# Patient Record
Sex: Female | Born: 1940 | Race: White | Hispanic: No | Marital: Married | Smoking: Never smoker
Health system: Southern US, Community
[De-identification: ages and names within clinical notes are randomized; demographics above are authoritative.]

## PROBLEM LIST (undated history)

## (undated) DIAGNOSIS — K5190 Ulcerative colitis, unspecified, without complications: Secondary | ICD-10-CM

## (undated) DIAGNOSIS — E785 Hyperlipidemia, unspecified: Secondary | ICD-10-CM

## (undated) DIAGNOSIS — H409 Unspecified glaucoma: Secondary | ICD-10-CM

## (undated) DIAGNOSIS — A159 Respiratory tuberculosis unspecified: Secondary | ICD-10-CM

## (undated) DIAGNOSIS — I10 Essential (primary) hypertension: Secondary | ICD-10-CM

## (undated) HISTORY — DX: Ulcerative colitis, unspecified, without complications: K51.90

## (undated) HISTORY — PX: BREAST BIOPSY: SHX20

## (undated) HISTORY — DX: Hyperlipidemia, unspecified: E78.5

## (undated) HISTORY — PX: BACK SURGERY: SHX140

## (undated) HISTORY — DX: Unspecified glaucoma: H40.9

## (undated) HISTORY — DX: Essential (primary) hypertension: I10

## (undated) HISTORY — DX: Respiratory tuberculosis unspecified: A15.9

---

## 2001-08-05 ENCOUNTER — Encounter: Payer: Self-pay | Admitting: Obstetrics and Gynecology

## 2001-08-05 ENCOUNTER — Encounter: Admission: RE | Admit: 2001-08-05 | Discharge: 2001-08-05 | Payer: Self-pay | Admitting: Obstetrics and Gynecology

## 2001-08-12 ENCOUNTER — Encounter: Admission: RE | Admit: 2001-08-12 | Discharge: 2001-08-12 | Payer: Self-pay | Admitting: Obstetrics and Gynecology

## 2001-08-12 ENCOUNTER — Encounter: Payer: Self-pay | Admitting: Obstetrics and Gynecology

## 2001-11-10 ENCOUNTER — Other Ambulatory Visit: Admission: RE | Admit: 2001-11-10 | Discharge: 2001-11-10 | Payer: Self-pay | Admitting: Obstetrics and Gynecology

## 2002-06-01 ENCOUNTER — Encounter: Payer: Self-pay | Admitting: Gastroenterology

## 2002-06-01 ENCOUNTER — Encounter: Admission: RE | Admit: 2002-06-01 | Discharge: 2002-06-01 | Payer: Self-pay | Admitting: Gastroenterology

## 2006-06-25 ENCOUNTER — Other Ambulatory Visit: Admission: RE | Admit: 2006-06-25 | Discharge: 2006-06-25 | Payer: Self-pay | Admitting: Internal Medicine

## 2006-07-02 ENCOUNTER — Encounter: Admission: RE | Admit: 2006-07-02 | Discharge: 2006-07-02 | Payer: Self-pay | Admitting: Internal Medicine

## 2008-04-06 ENCOUNTER — Other Ambulatory Visit: Admission: RE | Admit: 2008-04-06 | Discharge: 2008-04-06 | Payer: Self-pay | Admitting: Internal Medicine

## 2008-09-29 ENCOUNTER — Encounter: Admission: RE | Admit: 2008-09-29 | Discharge: 2008-09-29 | Payer: Self-pay | Admitting: Internal Medicine

## 2009-02-10 ENCOUNTER — Encounter: Admission: RE | Admit: 2009-02-10 | Discharge: 2009-02-10 | Payer: Self-pay | Admitting: Internal Medicine

## 2009-09-30 ENCOUNTER — Encounter: Admission: RE | Admit: 2009-09-30 | Discharge: 2009-09-30 | Payer: Self-pay | Admitting: Internal Medicine

## 2009-09-30 ENCOUNTER — Other Ambulatory Visit: Admission: RE | Admit: 2009-09-30 | Discharge: 2009-09-30 | Payer: Self-pay | Admitting: Geriatric Medicine

## 2010-11-09 ENCOUNTER — Encounter
Admission: RE | Admit: 2010-11-09 | Discharge: 2010-11-09 | Payer: Self-pay | Source: Home / Self Care | Attending: Internal Medicine | Admitting: Internal Medicine

## 2010-11-28 ENCOUNTER — Encounter
Admission: RE | Admit: 2010-11-28 | Discharge: 2010-11-28 | Payer: Self-pay | Source: Home / Self Care | Attending: Internal Medicine | Admitting: Internal Medicine

## 2012-03-14 ENCOUNTER — Other Ambulatory Visit: Payer: Self-pay | Admitting: Internal Medicine

## 2012-03-14 DIAGNOSIS — Z1231 Encounter for screening mammogram for malignant neoplasm of breast: Secondary | ICD-10-CM

## 2012-03-18 ENCOUNTER — Ambulatory Visit
Admission: RE | Admit: 2012-03-18 | Discharge: 2012-03-18 | Disposition: A | Discharge status: Home / Self Care | Payer: Medicare Other | Source: Ambulatory Visit | Attending: Internal Medicine | Admitting: Internal Medicine

## 2012-03-18 DIAGNOSIS — Z1231 Encounter for screening mammogram for malignant neoplasm of breast: Secondary | ICD-10-CM

## 2013-05-12 ENCOUNTER — Other Ambulatory Visit: Payer: Self-pay

## 2013-05-12 DIAGNOSIS — Z1231 Encounter for screening mammogram for malignant neoplasm of breast: Secondary | ICD-10-CM

## 2013-06-10 ENCOUNTER — Ambulatory Visit
Admission: RE | Admit: 2013-06-10 | Discharge: 2013-06-10 | Disposition: A | Discharge status: Home / Self Care | Payer: Medicare Other | Source: Ambulatory Visit

## 2013-06-10 DIAGNOSIS — Z1231 Encounter for screening mammogram for malignant neoplasm of breast: Secondary | ICD-10-CM

## 2014-10-28 ENCOUNTER — Other Ambulatory Visit: Payer: Self-pay

## 2014-10-28 DIAGNOSIS — Z1231 Encounter for screening mammogram for malignant neoplasm of breast: Secondary | ICD-10-CM

## 2014-11-17 ENCOUNTER — Ambulatory Visit
Admission: RE | Admit: 2014-11-17 | Discharge: 2014-11-17 | Disposition: A | Discharge status: Home / Self Care | Payer: Medicare Other | Source: Ambulatory Visit

## 2014-11-17 DIAGNOSIS — Z1231 Encounter for screening mammogram for malignant neoplasm of breast: Secondary | ICD-10-CM

## 2015-09-12 NOTE — Progress Notes (Signed)
HPI: 74 year old female for evaluation of edema. No prior cardiac history. Over the summer the patient noticed increased bilateral lower extremity edema left greater than right. However this occurred while traveling to [place]. It has improved since that time. Note it was worse during the day and improved overnight. She does have dyspnea on exertion which is chronic that she attributes to restriction from her spine and thorax issues. There is no orthopnea or PND. No chest pain or syncope.  Current Outpatient Prescriptions  Medication Sig Dispense Refill  . ALPHAGAN P 0.1 % SOLN Place 1 drop into both eyes 2 (two) times daily as needed.  4  . atorvastatin (LIPITOR) 10 MG tablet Take 5 mg by mouth daily.  12  . dorzolamide (TRUSOPT) 2 % ophthalmic solution Place 1 drop into both eyes 2 (two) times daily as needed.  3  . latanoprost (XALATAN) 0.005 % ophthalmic solution Place 1 drop into both eyes daily.  4  . lisinopril (PRINIVIL,ZESTRIL) 10 MG tablet Take 5 mg by mouth daily.    . Nutritional Supplements (DHEA PO) Take 1 capsule by mouth daily.    . Nutritional Supplements (JUICE PLUS FIBRE PO) Take 1 capsule by mouth 2 (two) times daily before a meal.    . progesterone (ENDOMETRIN) 100 MG vaginal insert Place 100 mg vaginally 2 (two) times daily.    Marland Kitchen. sulfaSALAzine (AZULFIDINE) 500 MG tablet Take 500 mg by mouth daily.    Marland Kitchen. testosterone (ANDROGEL) 50 MG/5GM (1%) GEL Place 5 g onto the skin daily.     No current facility-administered medications for this visit.    Allergies  Allergen Reactions  . Penicillins Rash     Past Medical History  Diagnosis Date  . Hypertension   . Hyperlipidemia   . Ulcerative colitis (HCC)   . Glaucoma   . Tuberculosis     Past Surgical History  Procedure Laterality Date  . Back surgery      Social History   Social History  . Marital Status: Married    Spouse Name: N/A  . Number of Children: N/A  . Years of Education: N/A    Occupational History  . Not on file.   Social History Main Topics  . Smoking status: Never Smoker   . Smokeless tobacco: Not on file  . Alcohol Use: 0.0 oz/week    0 Standard drinks or equivalent per week     Comment: Occasional  . Drug Use: Not on file  . Sexual Activity: Not on file   Other Topics Concern  . Not on file   Social History Narrative  . No narrative on file    Family History  Problem Relation Age of Onset  . CAD Brother     CABG age 5850s  . CAD Brother     CABG age 2570s    ROS: Mild back pain but no fevers or chills, productive cough, hemoptysis, dysphasia, odynophagia, melena, hematochezia, dysuria, hematuria, rash, seizure activity, orthopnea, PND,  claudication. Remaining systems are negative.  Physical Exam:   Blood pressure 132/52, pulse 77, height 4\' 4"  (1.321 m), weight 38.737 kg (85 lb 6.4 oz).  General:  Well developed/well nourished in NAD Skin warm/dry Patient not depressed No peripheral clubbing Back-previous TB related back surgery as child HEENT-normal/normal eyelids Neck supple/normal carotid upstroke bilaterally; no bruits; no JVD; no thyromegaly chest - CTA/ normal expansion; scoliosis CV - RRR/normal S1 and S2; no murmurs, rubs or gallops;  PMI nondisplaced Abdomen -  NT/ND, no HSM, no mass, + bowel sounds, no bruit 2+ femoral pulses, no bruits Ext-trace edema, no chords, 2+ DP Neuro-grossly nonfocal  ECG Normal sinus rhythm at a rate of 77. Left anterior fascicular block. No ST changes.

## 2015-09-13 ENCOUNTER — Encounter: Payer: Self-pay | Admitting: Cardiology

## 2015-09-13 ENCOUNTER — Ambulatory Visit (INDEPENDENT_AMBULATORY_CARE_PROVIDER_SITE_OTHER): Payer: Medicare Other | Admitting: Cardiology

## 2015-09-13 ENCOUNTER — Encounter: Payer: Self-pay | Admitting: *Deleted

## 2015-09-13 VITALS — BP 132/52 | HR 77 | Ht <= 58 in | Wt 85.4 lb

## 2015-09-13 DIAGNOSIS — I10 Essential (primary) hypertension: Secondary | ICD-10-CM | POA: Insufficient documentation

## 2015-09-13 DIAGNOSIS — E785 Hyperlipidemia, unspecified: Secondary | ICD-10-CM | POA: Diagnosis not present

## 2015-09-13 DIAGNOSIS — R609 Edema, unspecified: Secondary | ICD-10-CM

## 2015-09-13 NOTE — Assessment & Plan Note (Signed)
Possibly secondary to venous insufficiency. We will arrange an echocardiogram to evaluate left ventricular and right ventricular function. 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 Question if RV dysfunction could be contributing. Have instructed her to keep her feet elevated. We will consider low-dose diuretic in the future if symptoms worsen.

## 2015-09-13 NOTE — Assessment & Plan Note (Signed)
Blood pressure controlled. Continue present medications. 

## 2015-09-13 NOTE — Assessment & Plan Note (Signed)
Continue statin. 

## 2015-09-13 NOTE — Patient Instructions (Signed)
Your physician recommends that you schedule a follow-up appointment in: AS NEEDED PENDING TEST RESULTS  Your physician has requested that you have an echocardiogram. Echocardiography is a painless test that uses sound waves to create images of your heart. It provides your doctor with information about the size and shape of your heart and how well your heart's chambers and valves are working. This procedure takes approximately one hour. There are no restrictions for this procedure.    

## 2015-10-06 ENCOUNTER — Other Ambulatory Visit: Payer: Self-pay

## 2015-10-06 ENCOUNTER — Ambulatory Visit (HOSPITAL_COMMUNITY): Payer: Medicare Other | Attending: Cardiology

## 2015-10-06 DIAGNOSIS — R609 Edema, unspecified: Secondary | ICD-10-CM | POA: Diagnosis not present

## 2015-10-06 DIAGNOSIS — I517 Cardiomegaly: Secondary | ICD-10-CM | POA: Insufficient documentation

## 2015-10-06 DIAGNOSIS — I340 Nonrheumatic mitral (valve) insufficiency: Secondary | ICD-10-CM | POA: Insufficient documentation

## 2015-10-06 DIAGNOSIS — I10 Essential (primary) hypertension: Secondary | ICD-10-CM | POA: Diagnosis not present

## 2015-11-01 ENCOUNTER — Other Ambulatory Visit: Payer: Self-pay

## 2015-11-01 DIAGNOSIS — Z1231 Encounter for screening mammogram for malignant neoplasm of breast: Secondary | ICD-10-CM

## 2015-12-05 ENCOUNTER — Ambulatory Visit: Payer: Medicare Other

## 2016-07-12 ENCOUNTER — Ambulatory Visit
Admission: RE | Admit: 2016-07-12 | Discharge: 2016-07-12 | Disposition: A | Discharge status: Home / Self Care | Payer: Medicare Other | Source: Ambulatory Visit

## 2016-07-12 ENCOUNTER — Encounter: Payer: Self-pay | Admitting: Radiology

## 2016-07-12 DIAGNOSIS — Z1231 Encounter for screening mammogram for malignant neoplasm of breast: Secondary | ICD-10-CM

## 2017-07-11 ENCOUNTER — Other Ambulatory Visit: Payer: Self-pay | Admitting: Geriatric Medicine

## 2017-07-11 DIAGNOSIS — Z1231 Encounter for screening mammogram for malignant neoplasm of breast: Secondary | ICD-10-CM

## 2017-07-15 ENCOUNTER — Ambulatory Visit
Admission: RE | Admit: 2017-07-15 | Discharge: 2017-07-15 | Disposition: A | Discharge status: Home / Self Care | Payer: Medicare Other | Source: Ambulatory Visit | Attending: Geriatric Medicine | Admitting: Geriatric Medicine

## 2017-07-15 DIAGNOSIS — Z1231 Encounter for screening mammogram for malignant neoplasm of breast: Secondary | ICD-10-CM

## 2018-05-27 ENCOUNTER — Other Ambulatory Visit: Payer: Self-pay | Admitting: Geriatric Medicine

## 2018-05-27 DIAGNOSIS — N6310 Unspecified lump in the right breast, unspecified quadrant: Secondary | ICD-10-CM

## 2018-05-28 ENCOUNTER — Ambulatory Visit
Admission: RE | Admit: 2018-05-28 | Discharge: 2018-05-28 | Disposition: A | Discharge status: Home / Self Care | Payer: Medicare Other | Source: Ambulatory Visit | Attending: Geriatric Medicine | Admitting: Geriatric Medicine

## 2018-05-28 ENCOUNTER — Other Ambulatory Visit: Payer: Self-pay | Admitting: Geriatric Medicine

## 2018-05-28 DIAGNOSIS — N6310 Unspecified lump in the right breast, unspecified quadrant: Secondary | ICD-10-CM

## 2018-06-04 ENCOUNTER — Other Ambulatory Visit: Payer: Self-pay | Admitting: Geriatric Medicine

## 2018-06-04 ENCOUNTER — Ambulatory Visit
Admission: RE | Admit: 2018-06-04 | Discharge: 2018-06-04 | Disposition: A | Discharge status: Home / Self Care | Payer: Medicare Other | Source: Ambulatory Visit | Attending: Geriatric Medicine | Admitting: Geriatric Medicine

## 2018-06-04 DIAGNOSIS — N6310 Unspecified lump in the right breast, unspecified quadrant: Secondary | ICD-10-CM

## 2018-06-24 ENCOUNTER — Other Ambulatory Visit: Payer: Self-pay | Admitting: General Surgery

## 2018-06-24 DIAGNOSIS — N6310 Unspecified lump in the right breast, unspecified quadrant: Secondary | ICD-10-CM

## 2018-07-29 ENCOUNTER — Ambulatory Visit
Admission: RE | Admit: 2018-07-29 | Discharge: 2018-07-29 | Disposition: A | Discharge status: Home / Self Care | Payer: Medicare Other | Source: Ambulatory Visit | Attending: General Surgery | Admitting: General Surgery

## 2018-07-29 DIAGNOSIS — N6310 Unspecified lump in the right breast, unspecified quadrant: Secondary | ICD-10-CM

## 2018-10-22 ENCOUNTER — Other Ambulatory Visit (HOSPITAL_COMMUNITY): Payer: Self-pay | Admitting: Ophthalmology

## 2018-10-22 DIAGNOSIS — H349 Unspecified retinal vascular occlusion: Secondary | ICD-10-CM

## 2018-10-29 ENCOUNTER — Ambulatory Visit (HOSPITAL_COMMUNITY)
Admission: RE | Admit: 2018-10-29 | Discharge: 2018-10-29 | Disposition: A | Discharge status: Home / Self Care | Payer: Medicare Other | Source: Ambulatory Visit | Attending: Cardiology | Admitting: Cardiology

## 2018-10-29 DIAGNOSIS — H349 Unspecified retinal vascular occlusion: Secondary | ICD-10-CM | POA: Insufficient documentation

## 2018-11-27 ENCOUNTER — Telehealth: Payer: Self-pay | Admitting: *Deleted

## 2018-11-27 NOTE — Telephone Encounter (Signed)
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 The patient had carotid dopplers which were fine and they are asking if the patient would benefit from an echocardiogram. Spoke with pt, she has not seen dr Jens Som since 2016 and per dr Jens Som she would need to be seen. The patient declined to schedule at this time and said she would call back if she decided to schedule.

## 2018-12-23 ENCOUNTER — Other Ambulatory Visit (HOSPITAL_COMMUNITY): Payer: Self-pay | Admitting: Geriatric Medicine

## 2018-12-23 ENCOUNTER — Other Ambulatory Visit: Payer: Self-pay | Admitting: Geriatric Medicine

## 2018-12-23 DIAGNOSIS — H34231 Retinal artery branch occlusion, right eye: Secondary | ICD-10-CM

## 2018-12-24 ENCOUNTER — Other Ambulatory Visit (HOSPITAL_COMMUNITY): Payer: Medicare Other

## 2018-12-31 ENCOUNTER — Encounter: Payer: Self-pay | Admitting: Physician Assistant

## 2018-12-31 ENCOUNTER — Ambulatory Visit: Payer: Medicare Other | Admitting: Physician Assistant

## 2018-12-31 VITALS — BP 118/66 | HR 83 | Ht <= 58 in | Wt 80.0 lb

## 2018-12-31 DIAGNOSIS — R0789 Other chest pain: Secondary | ICD-10-CM

## 2018-12-31 DIAGNOSIS — E785 Hyperlipidemia, unspecified: Secondary | ICD-10-CM | POA: Diagnosis not present

## 2018-12-31 DIAGNOSIS — I10 Essential (primary) hypertension: Secondary | ICD-10-CM | POA: Diagnosis not present

## 2018-12-31 DIAGNOSIS — H349 Unspecified retinal vascular occlusion: Secondary | ICD-10-CM | POA: Diagnosis not present

## 2018-12-31 NOTE — Progress Notes (Signed)
Cardiology Office Note    Date:  01/02/2019   ID:  Barbara Ibarra, DOB 06-Jan-1941, MRN [id number]  PCP:  Merlene Laughter, MD  Cardiologist: Dr. Jens Som  Chief Complaint  Patient presents with  . Follow-up    seen for Dr. Jens Som    History of Present Illness:  Barbara Ibarra is a 78 y.o. female with past medical history of hypertension, hyperlipidemia and a history of ulcerative colitis.  She has no prior cardiac history.  She has noticed some bilateral lower extremity edema in the past and was seen by Dr. Jens Som in October 2016.  The lower extremity edema was felt to be related to venous insufficiency.  Echocardiogram obtained on 10/06/2015 showed EF 60 to 65%, grade 1 DD, mild MR.  More recently, she had carotid artery ultrasound obtained on 10/29/2018 which showed only minimal plaque.  Patient was referred to cardiology service for evaluation of possible retinal artery occlusion.  She says she was recently diagnosed by Dr. Doristine Locks of [facility] ophthalmology with retinal artery occlusion.  The record was faxed over to Dr. Laverle Hobby office who ordered a carotid ultrasound.  Echocardiogram was also ordered, however it was denied by her insurance due to lack of documented reason.  According to the patient, she does have occasional chest soreness on the left side, however this does not occur with exertion.  EKG also showed possible Q waves in the inferior leads and overall low voltage.  I recommended echocardiogram with bubble study as initial study.  The bubble study portion will allow [place] to make sure there is no contralateral embolization that can cause retinal artery occlusion.  I will also order a 30-day event monitor as well to make sure no arrhythmia such as atrial fibrillation or atrial flutter that can cause embolization to the retinal artery.  Her blood pressure is very well controlled and her last lipid panel in 2019 showed very well-controlled cholesterol as well.   Prior to the study, we will request the records from her PCP and ophthalmologist office to make sure she was indeed diagnosed with retinal artery occlusion and not retinal vein occlusion.  If it is retinal vein occlusion, then she does not need those studies anymore.   Past Medical History:  Diagnosis Date  . Glaucoma   . Hyperlipidemia   . Hypertension   . Tuberculosis   . Ulcerative colitis [facility])     Past Surgical History:  Procedure Laterality Date  . BACK SURGERY    . BREAST BIOPSY      Current Medications: Outpatient Medications Prior to Visit  Medication Sig Dispense Refill  . ALPHAGAN P 0.1 % SOLN Place 1 drop into both eyes 2 (two) times daily as needed.  4  . atorvastatin (LIPITOR) 10 MG tablet Take 5 mg by mouth daily.  12  . Coenzyme Q10 (CO Q 10 PO) Take 1 tablet by mouth daily.    . dorzolamide (TRUSOPT) 2 % ophthalmic solution Place 1 drop into both eyes 2 (two) times daily as needed.  3  . latanoprost (XALATAN) 0.005 % ophthalmic solution Place 1 drop into both eyes daily.  4  . lisinopril (PRINIVIL,ZESTRIL) 10 MG tablet Take 5 mg by mouth daily.    Marland Kitchen MAGNESIUM GLYCINATE PLUS PO Take 1 tablet by mouth daily.    Marland Kitchen MELATONIN ER PO Take 1 tablet by mouth daily.    . Nutritional Supplements (DHEA PO) Take 1 capsule by mouth daily.    . Nutritional  Supplements (JUICE PLUS FIBRE PO) Take 1 capsule by mouth 2 (two) times daily before a meal.    . Progesterone (ENDOMETRIN VA) Take by mouth.    . sulfaSALAzine (AZULFIDINE) 500 MG tablet Take 500 mg by mouth daily.    . progesterone (ENDOMETRIN) 100 MG vaginal insert Place 100 mg vaginally 2 (two) times daily.    Marland Kitchen. testosterone (ANDROGEL) 50 MG/5GM (1%) GEL Place 5 g onto the skin daily.     No facility-administered medications prior to visit.      Allergies:   Penicillins   Social History   Socioeconomic History  . Marital status: Married    Spouse name: Not on file  . Number of children: Not on file  . Years of  education: Not on file  . Highest education level: Not on file  Occupational History  . Not on file  Social Needs  . Financial resource strain: Not on file  . Food insecurity:    Worry: Not on file    Inability: Not on file  . Transportation needs:    Medical: Not on file    Non-medical: Not on file  Tobacco Use  . Smoking status: Never Smoker  . Smokeless tobacco: Never Used  Substance and Sexual Activity  . Alcohol use: Yes    Alcohol/week: 0.0 standard drinks    Comment: Occasional  . Drug use: Not on file  . Sexual activity: Not on file  Lifestyle  . Physical activity:    Days per week: Not on file    Minutes per session: Not on file  . Stress: Not on file  Relationships  . Social connections:    Talks on phone: Not on file    Gets together: Not on file    Attends religious service: Not on file    Active member of club or organization: Not on file    Attends meetings of clubs or organizations: Not on file    Relationship status: Not on file  Other Topics Concern  . Not on file  Social History Narrative  . Not on file     Family History:  The patient's family history includes CAD in her brother and brother.   ROS:   Please see the history of present illness.    ROS All other systems reviewed and are negative.   PHYSICAL EXAM:   VS:  BP 118/66 (BP Location: Left Arm, Patient Position: Sitting, Cuff Size: Normal)   Pulse 83   Ht 4\' 2"  (1.27 m)   Wt 80 lb (36.3 kg)   BMI 22.50 kg/m    GEN: Well nourished, well developed, in no acute distress  HEENT: normal  Neck: no JVD, carotid bruits, or masses Cardiac: RRR; no murmurs, rubs, or gallops,no edema  Respiratory:  clear to auscultation bilaterally, normal work of breathing GI: soft, nontender, nondistended, + BS MS: no deformity or atrophy  Skin: warm and dry, no rash Neuro:  Alert and Oriented x 3, Strength and sensation are intact Psych: euthymic mood, full affect  Wt Readings from Last 3 Encounters:    12/31/18 80 lb (36.3 kg)  09/13/15 85 lb 6.4 oz (38.7 kg)      Studies/Labs Reviewed:   EKG:  EKG is ordered today.  The ekg ordered today demonstrates normal sinus rhythm, poor R wave progression in the anterior leads, questionable Q waves in the inferior lead.  Recent Labs: No results found for requested labs within last 8760 hours.   Lipid Panel  No results found for: CHOL, TRIG, HDL, CHOLHDL, VLDL, LDLCALC, LDLDIRECT  Additional studies/ records that were reviewed today include:   Echo 10/06/2015 LV EF: 60% -   65% Study Conclusions  - Left ventricle: The cavity size was normal. There was mild focal   basal hypertrophy of the septum. Systolic function was normal.   The estimated ejection fraction was in the range of 60% to 65%.   Wall motion was normal; there were no regional wall motion   abnormalities. Doppler parameters are consistent with abnormal   left ventricular relaxation (grade 1 diastolic dysfunction).   Sigmoid shaped septum. - Mitral valve: There was mild regurgitation.    ASSESSMENT:    1. Retinal artery occlusion   2. Atypical chest pain   3. Essential hypertension   4. Hyperlipidemia, unspecified hyperlipidemia type      PLAN:  In order of problems listed above:  1. Renal artery occlusion: Case discussed with DOD Dr. Herbie Baltimore, we recommended echocardiogram with bubble study to make sure there is no possibility of contralateral embolization.  We will also order a 30-day event monitor to rule out atrial fibrillation or atrial flutter  2. Atypical chest pain: We will follow-up on the echocardiogram result.  Chest discomfort does not correlate with exertion.  We will hold off on ischemic work-up at this time.  3. Hypertension: Blood pressure very well controlled recently  4. Hyperlipidemia: Despite recent diagnosis of retinal artery occlusion, her cholesterol is very well controlled.    Medication Adjustments/Labs and Tests Ordered: Current  medicines are reviewed at length with the patient today.  Concerns regarding medicines are outlined above.  Medication changes, Labs and Tests ordered today are listed in the Patient Instructions below. Patient Instructions  Medication Instructions:  Your physician recommends that you continue on your current medications as directed. Please refer to the Current Medication list given to you today. If you need a refill on your cardiac medications before your next appointment, please call your pharmacy.   Lab work: None  If you have labs (blood work) drawn today and your tests are completely normal, you will receive your results only by: Marland Kitchen MyChart Message (if you have MyChart) OR . A paper copy in the mail If you have any lab test that is abnormal or we need to change your treatment, we will call you to review the results.  Testing/Procedures: Your physician has requested that you have an echocardiogram complete with bubble study. Echocardiography is a painless test that uses sound waves to create images of your heart. It provides your doctor with information about the size and shape of your heart and how well your heart's chambers and valves are working. This procedure takes approximately one hour. There are no restrictions for this procedure. [address] Schedule test for some time next week  Your physician has recommended that you wear an 30 days event monitor. Event monitors are medical devices that record the heart's electrical activity. Doctors most often [place] these monitors to diagnose arrhythmias. Arrhythmias are problems with the speed or rhythm of the heartbeat. The monitor is a small, portable device. You can wear one while you do your normal daily activities. This is usually used to diagnose what is causing palpitations/syncope (passing out). SCHEDULE MONITOR FOR 2 WEEKS OUT  [address]  Follow-Up: At [facility], you and your health needs are our  priority.  As part of our continuing mission to provide you with  exceptional heart care, we have created designated Provider Care Teams.  These Care Teams include your primary Cardiologist (physician) and Advanced Practice Providers (APPs -  Physician Assistants and Nurse Practitioners) who all work together to provide you with the care you need, when you need it.  . Your physician recommends that you schedule a follow-up appointment in: 2 MONTHS WITH DR Jens SomRENSHAW  Any Other Special Instructions Will Be Listed Below (If Applicable). WE WILL REQUEST YOUR RECORDS TODAY AND HOPE TO GET THEM BACK IN THE NEXT FEW DAYS      Leonides SchanzSigned, Azalee CourseHao Hayli Milligan, [place]  01/02/2019 10:49 PM    [facility] [address], [place], [place]  [address] Phone: [phone]; Fax: [phone]

## 2018-12-31 NOTE — Patient Instructions (Addendum)
Medication Instructions:  Your physician recommends that you continue on your current medications as directed. Please refer to the Current Medication list given to you today. If you need a refill on your cardiac medications before your next appointment, please call your pharmacy.   Lab work: None  If you have labs (blood work) drawn today and your tests are completely normal, you will receive your results only by: Marland Kitchen MyChart Message (if you have MyChart) OR . A paper copy in the mail If you have any lab test that is abnormal or we need to change your treatment, we will call you to review the results.  Testing/Procedures: Your physician has requested that you have an echocardiogram complete with bubble study. Echocardiography is a painless test that uses sound waves to create images of your heart. It provides your doctor with information about the size and shape of your heart and how well your heart's chambers and valves are working. This procedure takes approximately one hour. There are no restrictions for this procedure. [address] Schedule test for some time next week  Your physician has recommended that you wear an 30 days event monitor. Event monitors are medical devices that record the heart's electrical activity. Doctors most often [place] these monitors to diagnose arrhythmias. Arrhythmias are problems with the speed or rhythm of the heartbeat. The monitor is a small, portable device. You can wear one while you do your normal daily activities. This is usually used to diagnose what is causing palpitations/syncope (passing out). SCHEDULE MONITOR FOR 2 WEEKS OUT  [address]  Follow-Up: At [facility], you and your health needs are our priority.  As part of our continuing mission to provide you with exceptional heart care, we have created designated Provider Care Teams.  These Care Teams include your primary Cardiologist (physician) and Advanced Practice  Providers (APPs -  Physician Assistants and Nurse Practitioners) who all work together to provide you with the care you need, when you need it.  . Your physician recommends that you schedule a follow-up appointment in: 2 MONTHS WITH DR Jens Som  Any Other Special Instructions Will Be Listed Below (If Applicable). WE WILL REQUEST YOUR RECORDS TODAY AND HOPE TO GET THEM BACK IN THE NEXT FEW DAYS

## 2019-01-02 ENCOUNTER — Encounter: Payer: Self-pay | Admitting: Physician Assistant

## 2019-01-05 ENCOUNTER — Ambulatory Visit (HOSPITAL_COMMUNITY): Payer: Medicare Other | Attending: Cardiology

## 2019-01-05 ENCOUNTER — Telehealth: Payer: Self-pay | Admitting: *Deleted

## 2019-01-05 DIAGNOSIS — R0789 Other chest pain: Secondary | ICD-10-CM | POA: Insufficient documentation

## 2019-01-05 NOTE — Telephone Encounter (Signed)
Greeter called back to triage on this pt, who received an echo with bubble study this morning.  Per the Greeter, the pt had her echo w/ bubble study done this morning, was leaving the building and got down to the parking deck, and her husband stated that she became dizzy, so he brought her back up to the office.  Myself and echo tech Marcelle Smiling went out to the lobby to see the pt and husband, and upon talking to the pt, she was noted to be in NO obvious distress.  Took the pt back into a room and asked her symptoms, and she replied she just felt dizzy after having the echo done.  Pt states she ate oatmeal and drank coffee this morning, so she did not feel that it was "nutrition related." Per Edwena Blow, the pts echo showed no acute findings in which the DOD should have been made aware of, before discharging her.  Informed the pt of this.  Offered the pt a full cup of water and saltine crackers, and she stated she was feeling much better.  Pt is now sitting in the echo dept in one of their rooms, being held for observation, while she is eating and drinking.  Pts husband is by her side. Echo Tech and RN in Nuclear Dept will be checking in on the pt, while she is there.  Per [employer], she will make the ordering Provider, Azalee Course PA-C aware of pts issue, and to read the echo and follow-up with the pt.  Will send this message to [place] as an [place].

## 2019-01-07 ENCOUNTER — Telehealth: Payer: Self-pay

## 2019-01-07 ENCOUNTER — Other Ambulatory Visit (HOSPITAL_COMMUNITY): Payer: Medicare Other

## 2019-01-07 NOTE — Telephone Encounter (Signed)
Follow up  ° ° ° °Patient is returning call in reference to echo results. Please call.  °

## 2019-01-07 NOTE — Progress Notes (Signed)
Left voice message for the patient to call back for ECHO results. 

## 2019-01-07 NOTE — Progress Notes (Signed)
The patient has been notified of the result and verbalized understanding.  All questions (if any) were answered. Dorris Fetch, CMA 01/07/2019 3:25 PM

## 2019-01-07 NOTE — Telephone Encounter (Signed)
Spoke with patient results were given, all (if any) questions were answered. Patient asked that her results be sent to her PCP. Fax was sent via EPIC

## 2019-01-07 NOTE — Telephone Encounter (Signed)
Left voice message for patient for ECHO results.

## 2019-01-14 ENCOUNTER — Other Ambulatory Visit: Payer: Self-pay | Admitting: Cardiology

## 2019-01-14 ENCOUNTER — Ambulatory Visit (INDEPENDENT_AMBULATORY_CARE_PROVIDER_SITE_OTHER): Payer: Medicare Other

## 2019-01-14 ENCOUNTER — Other Ambulatory Visit (HOSPITAL_COMMUNITY): Payer: Medicare Other

## 2019-01-14 DIAGNOSIS — R0789 Other chest pain: Secondary | ICD-10-CM

## 2019-01-14 DIAGNOSIS — I4891 Unspecified atrial fibrillation: Secondary | ICD-10-CM

## 2019-01-14 DIAGNOSIS — H349 Unspecified retinal vascular occlusion: Secondary | ICD-10-CM | POA: Diagnosis not present

## 2019-01-19 ENCOUNTER — Telehealth: Payer: Self-pay | Admitting: *Deleted

## 2019-01-19 NOTE — Telephone Encounter (Signed)
Spoke with pt re serious recording at 5:45 pm on 01/16/19 pt had episode of v tach 7 beat run Per pt no symptoms recording left with Deliah Goody RN per Dr Jens Som to review .Barbara Ibarra

## 2019-01-22 NOTE — Telephone Encounter (Signed)
Strips and echo results reviewed by dr Jens Som. No change in treatment at this current time.

## 2019-02-26 ENCOUNTER — Encounter: Payer: Self-pay | Admitting: Cardiology

## 2019-02-26 ENCOUNTER — Telehealth: Payer: Self-pay

## 2019-02-26 NOTE — Progress Notes (Signed)
Virtual Visit via Telephone Note    Evaluation Performed:  Follow-up visit  This visit type was conducted due to national recommendations for restrictions regarding the COVID-19 Pandemic (e.g. social distancing).  This format is felt to be most appropriate for this patient at this time.  All issues noted in this document were discussed and addressed.  No physical exam was performed (except for noted visual exam findings with Video Visits).  Please refer to the patient's chart (MyChart message for video visits and phone note for telephone visits) for the patient's consent to telehealth for [facility].  Date:  02/27/2019   ID:  Barbara Ibarra, DOB 1941/08/11, MRN [id number]  Patient Location:  [facility]  Provider location:   [place], [place]  PCP:  Merlene Laughter, MD  Cardiologist:  Dr Jens Som Electrophysiologist:    Chief Complaint:  FU edema, hypertension and previous retinal artery occlusion.   History of Present Illness:    Barbara Ibarra is a 78 y.o. female who presents via audio/video conferencing for a telehealth visit today.  The patient does not have symptoms concerning for COVID-19 infection (fever, chills, cough, or new shortness of breath).   Seen 2/20 with possible retinal artery occlusion; carotid dopplers 12/19 showed no significant stenosis. Echo 2/20 showed normal LV function, mild diastolic dysfunction, mild LAE; negative saline microcavitation study. Monitor 2/20 showed sinus brady, NSR, sinus tach and 7 beats NSVT. Since last seen, patient denies dyspnea, chest pain, palpitations or syncope.  Past Medical History:  Diagnosis Date  . Glaucoma   . Hyperlipidemia   . Hypertension   . Tuberculosis   . Ulcerative colitis [facility])    Past Surgical History:  Procedure Laterality Date  . BACK SURGERY    . BREAST BIOPSY       Current Meds  Medication Sig  . ALPHAGAN P 0.1 % SOLN Place 1 drop into both eyes 2 (two) times daily as needed.  Marland Kitchen  atorvastatin (LIPITOR) 10 MG tablet Take 10 mg by mouth daily.   . dorzolamide (TRUSOPT) 2 % ophthalmic solution Place 1 drop into both eyes 2 (two) times daily as needed.  . latanoprost (XALATAN) 0.005 % ophthalmic solution Place 1 drop into both eyes daily.  Marland Kitchen lisinopril (PRINIVIL,ZESTRIL) 10 MG tablet Take 10 mg by mouth daily.   Marland Kitchen MAGNESIUM GLYCINATE PLUS PO Take 1 tablet by mouth daily.  Marland Kitchen MELATONIN ER PO Take 1 tablet by mouth daily.  . Nutritional Supplements (JUICE PLUS FIBRE PO) Take 1 capsule by mouth 2 (two) times daily before a meal.  . Progesterone (ENDOMETRIN VA) Take by mouth.  . sulfaSALAzine (AZULFIDINE) 500 MG tablet Take 500 mg by mouth daily. Takes 6 tablets once daily  . Thyroid (NATURE-THROID PO) Take by mouth.     Allergies:   Penicillins   Social History   Tobacco Use  . Smoking status: Never Smoker  . Smokeless tobacco: Never Used  Substance Use Topics  . Alcohol use: Yes    Alcohol/week: 0.0 standard drinks    Comment: Occasional  . Drug use: Never     Family Hx: The patient's family history includes CAD in her brother and brother.  ROS:   Please see the history of present illness.    Patient denies fevers, chills, productive cough, hemoptysis. All other systems reviewed and are negative.   Labs/Other Tests and Data Reviewed:     Wt Readings from Last 3 Encounters:  02/27/19 76 lb 3.2 oz (34.6 kg)  12/31/18  80 lb (36.3 kg)  09/13/15 85 lb 6.4 oz (38.7 kg)     Objective:    Vital Signs:  BP 120/63   Pulse 80   Ht 4\' 2"  (1.27 m)   Wt 76 lb 3.2 oz (34.6 kg)   BMI 21.43 kg/m    Physical exam not performed because of tele-visit.  ASSESSMENT & PLAN:    1.  History of retinal artery occlusion-cardiac work-up is negative thus far.  Echocardiogram showed normal LV function and microcavitation study negative.  Carotid Dopplers showed no significant stenosis.  Monitor did not show atrial fibrillation.  2 hypertension-patient's blood pressure  is controlled.  Continue present medications and follow.  3 hyperlipidemia-continue statin.  Lipids and liver monitored by primary care.  4 chest pain-she has had no further symptoms.  Echocardiogram showed normal LV function.  We will not pursue further ischemia evaluation at this point.  COVID-19 Education: The signs and symptoms of COVID-19 were discussed with the patient and how to seek care for testing (follow up with PCP or arrange E-visit). The importance of social distancing was discussed today.  Patient Risk:   After full review of this patient's clinical status, I feel that they are at least moderate risk at this time.  Time:   Today, I have spent 20 minutes with the patient with telehealth technology discussing retinal artery occlusion.     Medication Adjustments/Labs and Tests Ordered: Current medicines are reviewed at length with the patient today.  Concerns regarding medicines are outlined above.  Tests Ordered: No orders of the defined types were placed in this encounter.  Medication Changes: No orders of the defined types were placed in this encounter.   Disposition:  Follow up in 1 year(s)  Signed, Olga Millers, MD  02/27/2019 2:57 PM    [facility]

## 2019-02-26 NOTE — Telephone Encounter (Signed)
Virtual Visit Pre-Appointment Phone Call  Steps For Call:  1. Confirm consent - "In the setting of the current Covid19 crisis, you are scheduled for a (phone or video) visit with your provider on (date) at (time).  Just as we do with many in-office visits, in order for you to participate in this visit, we must obtain consent.  If you'd like, I can send this to your mychart (if signed up) or email for you to review.  Otherwise, I can obtain your verbal consent now.  All virtual visits are billed to your insurance company just like a normal visit would be.  By agreeing to a virtual visit, we'd like you to understand that the technology does not allow for your provider to perform an examination, and thus may limit your provider's ability to fully assess your condition.  Finally, though the technology is pretty good, we cannot assure that it will always work on either your or our end, and in the setting of a video visit, we may have to convert it to a phone-only visit.  In either situation, we cannot ensure that we have a secure connection.  Are you willing to proceed?"  2. Give patient instructions for WebEx download to smartphone as below if video visit  3. Advise patient to be prepared with any vital sign or heart rhythm information, their current medicines, and a piece of paper and pen handy for any instructions they may receive the day of their visit  4. Inform patient they will receive a phone call 15 minutes prior to their appointment time (may be from unknown caller ID) so they should be prepared to answer  5. Confirm that appointment type is correct in Epic appointment notes (video vs telephone)    TELEPHONE CALL NOTE  Barbara Ibarra has been deemed a candidate for a follow-up tele-health visit to limit community exposure during the Covid-19 pandemic. I spoke with the patient via phone to ensure availability of phone/video source, confirm preferred email & phone number, and discuss  instructions and expectations.  I reminded Makynze Babbitt to be prepared with any vital sign and/or heart rhythm information that could potentially be obtained via home monitoring, at the time of her visit. I reminded Annaston Gallego to expect a phone call at the time of her visit if her visit.  Did the patient verbally acknowledge consent to treatment? YES  Dorris Fetch, CMA 02/26/2019 11:50 AM   DOWNLOADING THE WEBEX SOFTWARE TO SMARTPHONE  - If Apple, go to [employer] and type in WebEx in the search bar. Download [employer], the blue/green circle. The app is free but as with any other app downloads, their phone may require them to verify saved payment information or Apple password. The patient does NOT have to create an account.  - If Android, ask patient to go to [employer] and type in WebEx in the search bar. Download [employer], the blue/green circle. The app is free but as with any other app downloads, their phone may require them to verify saved payment information or Android password. The patient does NOT have to create an account.   CONSENT FOR TELE-HEALTH VISIT - PLEASE REVIEW  I hereby voluntarily request, consent and authorize [facility] and its employed or contracted physicians, physician assistants, nurse practitioners or other licensed health care professionals (the Practitioner), to provide me with telemedicine health care services (the "Services") as deemed necessary by the treating Practitioner. I  acknowledge and consent to receive the Services by the Practitioner via telemedicine. I understand that the telemedicine visit will involve communicating with the Practitioner through live audiovisual communication technology and the disclosure of certain medical information by electronic transmission. I acknowledge that I have been given the opportunity to request an in-person assessment or other available alternative prior to the telemedicine  visit and am voluntarily participating in the telemedicine visit.  I understand that I have the right to withhold or withdraw my consent to the use of telemedicine in the course of my care at any time, without affecting my right to future care or treatment, and that the Practitioner or I may terminate the telemedicine visit at any time. I understand that I have the right to inspect all information obtained and/or recorded in the course of the telemedicine visit and may receive copies of available information for a reasonable fee.  I understand that some of the potential risks of receiving the Services via telemedicine include:  Marland Kitchen Delay or interruption in medical evaluation due to technological equipment failure or disruption; . Information transmitted may not be sufficient (e.g. poor resolution of images) to allow for appropriate medical decision making by the Practitioner; and/or  . In rare instances, security protocols could fail, causing a breach of personal health information.  Furthermore, I acknowledge that it is my responsibility to provide information about my medical history, conditions and care that is complete and accurate to the best of my ability. I acknowledge that Practitioner's advice, recommendations, and/or decision may be based on factors not within their control, such as incomplete or inaccurate data provided by me or distortions of diagnostic images or specimens that may result from electronic transmissions. I understand that the practice of medicine is not an exact science and that Practitioner makes no warranties or guarantees regarding treatment outcomes. I acknowledge that I will receive a copy of this consent concurrently upon execution via email to the email address I last provided but may also request a printed copy by calling the office of [facility].    I understand that my insurance will be billed for this visit.   I have read or had this consent read to me. . I  understand the contents of this consent, which adequately explains the benefits and risks of the Services being provided via telemedicine.  . I have been provided ample opportunity to ask questions regarding this consent and the Services and have had my questions answered to my satisfaction. . I give my informed consent for the services to be provided through the use of telemedicine in my medical care  By participating in this telemedicine visit I agree to the above.

## 2019-02-27 ENCOUNTER — Encounter: Payer: Self-pay | Admitting: Cardiology

## 2019-02-27 ENCOUNTER — Telehealth (INDEPENDENT_AMBULATORY_CARE_PROVIDER_SITE_OTHER): Payer: Medicare Other | Admitting: Cardiology

## 2019-02-27 VITALS — BP 120/63 | HR 80 | Ht <= 58 in | Wt 76.2 lb

## 2019-02-27 DIAGNOSIS — E7800 Pure hypercholesterolemia, unspecified: Secondary | ICD-10-CM

## 2019-02-27 DIAGNOSIS — R072 Precordial pain: Secondary | ICD-10-CM

## 2019-02-27 DIAGNOSIS — I10 Essential (primary) hypertension: Secondary | ICD-10-CM

## 2019-02-27 DIAGNOSIS — H349 Unspecified retinal vascular occlusion: Secondary | ICD-10-CM

## 2019-02-27 NOTE — Patient Instructions (Signed)

## 2019-03-09 ENCOUNTER — Ambulatory Visit: Payer: Medicare Other | Admitting: Cardiology

## 2019-07-24 ENCOUNTER — Other Ambulatory Visit: Payer: Self-pay | Admitting: Geriatric Medicine

## 2019-07-24 DIAGNOSIS — Z1231 Encounter for screening mammogram for malignant neoplasm of breast: Secondary | ICD-10-CM

## 2019-07-24 DIAGNOSIS — M810 Age-related osteoporosis without current pathological fracture: Secondary | ICD-10-CM

## 2019-10-02 ENCOUNTER — Other Ambulatory Visit: Payer: Self-pay

## 2019-10-02 ENCOUNTER — Ambulatory Visit
Admission: RE | Admit: 2019-10-02 | Discharge: 2019-10-02 | Disposition: A | Discharge status: Home / Self Care | Payer: Medicare Other | Source: Ambulatory Visit | Attending: Geriatric Medicine | Admitting: Geriatric Medicine

## 2019-10-02 DIAGNOSIS — Z1231 Encounter for screening mammogram for malignant neoplasm of breast: Secondary | ICD-10-CM

## 2019-10-02 DIAGNOSIS — M810 Age-related osteoporosis without current pathological fracture: Secondary | ICD-10-CM

## 2020-04-20 NOTE — Progress Notes (Signed)
HPI: FU retinal artery occlusion and hypertension. Seen 2/20 with possible retinal artery occlusion; carotid dopplers 12/19 showed no significant stenosis. Echo 2/20 showed normal LV function, mild diastolic dysfunction, mild LAE; negative saline microcavitation study. Monitor 2/20 showed sinus brady, NSR, sinus tach and 7 beats NSVT. 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  There is no orthopnea, PND, chest pain or syncope.  Current Outpatient Medications  Medication Sig Dispense Refill  . ALPHAGAN P 0.1 % SOLN Place 1 drop into both eyes 2 (two) times daily as needed.  4  . atorvastatin (LIPITOR) 10 MG tablet Take 10 mg by mouth daily.   12  . dorzolamide (TRUSOPT) 2 % ophthalmic solution Place 1 drop into both eyes 2 (two) times daily as needed.  3  . latanoprost (XALATAN) 0.005 % ophthalmic solution Place 1 drop into both eyes daily.  4  . lisinopril (PRINIVIL,ZESTRIL) 10 MG tablet Take 10 mg by mouth daily.     Marland Kitchen MAGNESIUM GLYCINATE PLUS PO Take 1 tablet by mouth daily.    Marland Kitchen MELATONIN ER PO Take 1 tablet by mouth daily.    . Nutritional Supplements (JUICE PLUS FIBRE PO) Take 1 capsule by mouth 2 (two) times daily before a meal.    . Progesterone (ENDOMETRIN VA) Take by mouth.    . sulfaSALAzine (AZULFIDINE) 500 MG tablet Take 500 mg by mouth daily. Takes 6 tablets once daily    . Thyroid (NATURE-THROID PO) Take by mouth.     No current facility-administered medications for this visit.     Past Medical History:  Diagnosis Date  . Glaucoma   . Hyperlipidemia   . Hypertension   . Tuberculosis   . Ulcerative colitis [facility])     Past Surgical History:  Procedure Laterality Date  . BACK SURGERY    . BREAST BIOPSY      Social History   Socioeconomic History  . Marital status: Married    Spouse name: Not on file  . Number of children: Not on file  . Years of education: Not on file  . Highest  education level: Not on file  Occupational History  . Not on file  Tobacco Use  . Smoking status: Never Smoker  . Smokeless tobacco: Never Used  Substance and Sexual Activity  . Alcohol use: Yes    Alcohol/week: 0.0 standard drinks    Comment: Occasional  . Drug use: Never  . Sexual activity: Not on file  Other Topics Concern  . Not on file  Social History Narrative  . Not on file   Social Determinants of Health   Financial Resource Strain:   . Difficulty of Paying Living Expenses:   Food Insecurity:   . Worried About Charity fundraiser in the Last Year:   . Arboriculturist in the Last Year:   Transportation Needs:   . Film/video editor (Medical):   Marland Kitchen Lack of Transportation (Non-Medical):   Physical Activity:   . Days of Exercise per Week:   . Minutes of Exercise per Session:   Stress:   . Feeling of Stress :   Social Connections:   . Frequency of Communication with Friends and Family:   . Frequency of Social Gatherings with Friends and Family:   . Attends Religious Services:   . Active Member of Clubs or Organizations:   . Attends Archivist Meetings:   .  Marital Status:   Intimate Partner Violence:   . Fear of Current or Ex-Partner:   . Emotionally Abused:   Marland Kitchen Physically Abused:   . Sexually Abused:     Family History  Problem Relation Age of Onset  . CAD Brother        CABG age 64s  . CAD Brother        CABG age 52s    ROS: no fevers or chills, productive cough, hemoptysis, dysphasia, odynophagia, melena, hematochezia, dysuria, hematuria, rash, seizure activity, orthopnea, PND, pedal edema, claudication. Remaining systems are negative.  Physical Exam: Well-developed well-nourished in no acute distress.  Skin is warm and dry.  HEENT is normal.  Neck is supple.  Chest is clear to auscultation with normal expansion.  Scoliosis noted. Cardiovascular exam is regular rate and rhythm.  Abdominal exam nontender or distended. No masses  palpated. Extremities show no edema. neuro grossly intact  ECG-sinus rhythm at a rate of 84, left axis deviation, cannot rule out septal infarct.  Personally reviewed  A/P  1 retinal artery occlusion-patient had evaluation in the past for this issue.  Her echocardiogram showed normal LV function and negative saline microcavitation study.  Carotid Doppler showed no significant stenosis.  Her monitor showed no atrial fibrillation.  We will continue to follow.  2 hypertension-patient's blood pressure is controlled today.  Continue present medical regimen.  3 hyperlipidemia-continue statin.  4 history of chest pain-no recurrent symptoms.  Olga Millers, MD

## 2020-04-26 ENCOUNTER — Other Ambulatory Visit: Payer: Self-pay

## 2020-04-26 ENCOUNTER — Encounter: Payer: Self-pay | Admitting: Cardiology

## 2020-04-26 ENCOUNTER — Ambulatory Visit: Payer: Medicare Other | Admitting: Cardiology

## 2020-04-26 VITALS — BP 140/71 | HR 84 | Temp 96.4°F | Ht <= 58 in | Wt 84.0 lb

## 2020-04-26 DIAGNOSIS — H349 Unspecified retinal vascular occlusion: Secondary | ICD-10-CM

## 2020-04-26 DIAGNOSIS — E7800 Pure hypercholesterolemia, unspecified: Secondary | ICD-10-CM

## 2020-04-26 DIAGNOSIS — I10 Essential (primary) hypertension: Secondary | ICD-10-CM | POA: Diagnosis not present

## 2020-04-26 NOTE — Patient Instructions (Signed)

## 2021-03-17 DIAGNOSIS — R600 Localized edema: Secondary | ICD-10-CM | POA: Diagnosis not present

## 2021-03-17 DIAGNOSIS — I10 Essential (primary) hypertension: Secondary | ICD-10-CM | POA: Diagnosis not present

## 2021-03-17 DIAGNOSIS — D649 Anemia, unspecified: Secondary | ICD-10-CM | POA: Diagnosis not present

## 2021-03-17 DIAGNOSIS — Z79899 Other long term (current) drug therapy: Secondary | ICD-10-CM | POA: Diagnosis not present

## 2021-03-17 DIAGNOSIS — R634 Abnormal weight loss: Secondary | ICD-10-CM | POA: Diagnosis not present

## 2021-03-17 DIAGNOSIS — K529 Noninfective gastroenteritis and colitis, unspecified: Secondary | ICD-10-CM | POA: Diagnosis not present

## 2021-03-22 ENCOUNTER — Other Ambulatory Visit: Payer: Self-pay | Admitting: Gastroenterology

## 2021-03-22 DIAGNOSIS — R1030 Lower abdominal pain, unspecified: Secondary | ICD-10-CM | POA: Diagnosis not present

## 2021-03-22 DIAGNOSIS — K529 Noninfective gastroenteritis and colitis, unspecified: Secondary | ICD-10-CM

## 2021-03-22 DIAGNOSIS — R197 Diarrhea, unspecified: Secondary | ICD-10-CM | POA: Diagnosis not present

## 2021-04-03 ENCOUNTER — Ambulatory Visit
Admission: RE | Admit: 2021-04-03 | Discharge: 2021-04-03 | Disposition: A | Discharge status: Home / Self Care | Payer: Medicare Other | Source: Ambulatory Visit | Attending: Gastroenterology | Admitting: Gastroenterology

## 2021-04-03 DIAGNOSIS — K529 Noninfective gastroenteritis and colitis, unspecified: Secondary | ICD-10-CM | POA: Diagnosis not present

## 2021-04-03 DIAGNOSIS — K8020 Calculus of gallbladder without cholecystitis without obstruction: Secondary | ICD-10-CM | POA: Diagnosis not present

## 2021-04-03 DIAGNOSIS — K5190 Ulcerative colitis, unspecified, without complications: Secondary | ICD-10-CM | POA: Diagnosis not present

## 2021-04-03 DIAGNOSIS — K5100 Ulcerative (chronic) pancolitis without complications: Secondary | ICD-10-CM | POA: Diagnosis not present

## 2021-04-03 MED ORDER — IOPAMIDOL (ISOVUE-300) INJECTION 61%
100.0000 mL | Freq: Once | INTRAVENOUS | Status: AC | PRN
  Administered 2021-04-03: 100 mL via INTRAVENOUS

## 2021-05-18 DIAGNOSIS — E039 Hypothyroidism, unspecified: Secondary | ICD-10-CM | POA: Diagnosis not present

## 2021-05-18 DIAGNOSIS — I10 Essential (primary) hypertension: Secondary | ICD-10-CM | POA: Diagnosis not present

## 2021-05-18 DIAGNOSIS — K5190 Ulcerative colitis, unspecified, without complications: Secondary | ICD-10-CM | POA: Diagnosis not present

## 2021-05-18 DIAGNOSIS — E785 Hyperlipidemia, unspecified: Secondary | ICD-10-CM | POA: Diagnosis not present

## 2021-05-22 DIAGNOSIS — K5190 Ulcerative colitis, unspecified, without complications: Secondary | ICD-10-CM | POA: Diagnosis not present

## 2021-05-22 DIAGNOSIS — A1781 Tuberculoma of brain and spinal cord: Secondary | ICD-10-CM | POA: Diagnosis not present

## 2021-05-22 DIAGNOSIS — H409 Unspecified glaucoma: Secondary | ICD-10-CM | POA: Diagnosis not present

## 2021-05-22 DIAGNOSIS — E785 Hyperlipidemia, unspecified: Secondary | ICD-10-CM | POA: Diagnosis not present

## 2021-06-05 ENCOUNTER — Ambulatory Visit: Payer: Medicare Other | Admitting: Cardiology

## 2022-07-18 IMAGING — CT CT ABD-PELV W/ CM
3 of 4 series · 13 of 32 positions shown, 18 images · IV contrast (APPLIED)
Comparison: None.

CLINICAL DATA: History of ulcer colitis.  Evaluate for recurrence.

EXAM:
CT ABDOMEN AND PELVIS WITH CONTRAST
TECHNIQUE: Multidetector CT imaging of the abdomen and pelvis was performed
using the standard protocol following bolus administration of
intravenous contrast.
CONTRAST:  100mL [id number] IOPAMIDOL ([id number]) INJECTION 61%

[Series 3: abd/pelvis w/cm · axial · 0.61mm/px · z∈[-268,-13]mm · 8 of 67 slices shown, 13 images]
[im 8/67  soft-tissue]
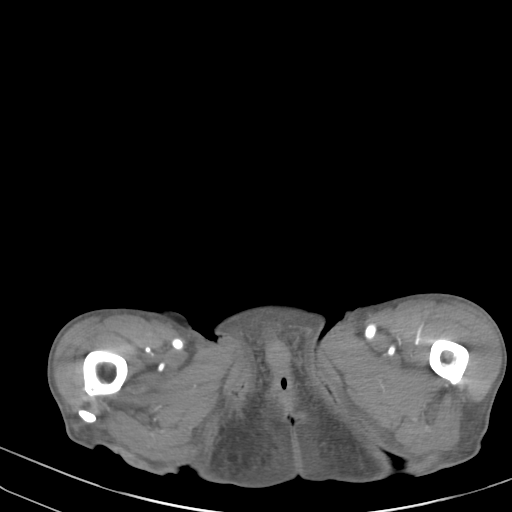
[im 8/67  bone]
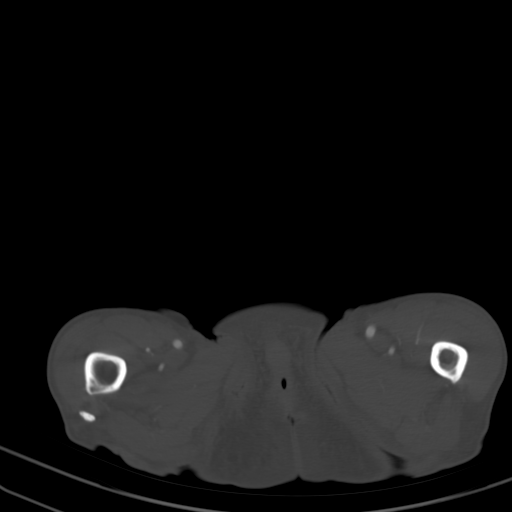
[im 15/67  soft-tissue]
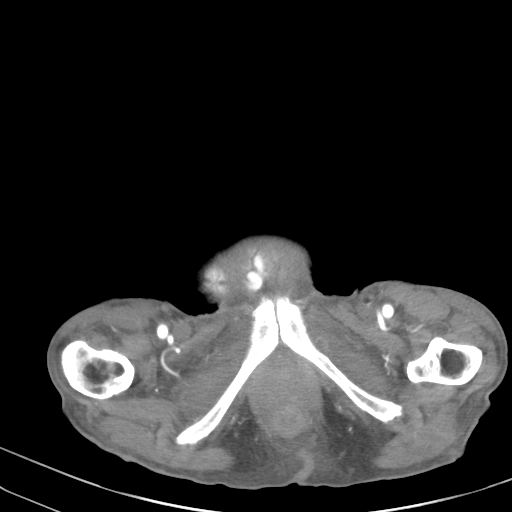
[im 23/67  soft-tissue]
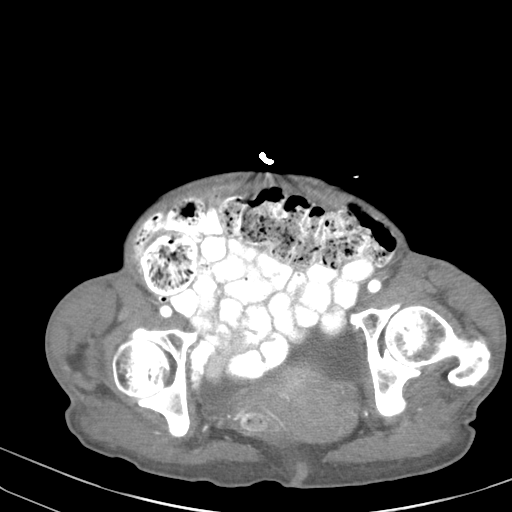
[im 30/67  soft-tissue]
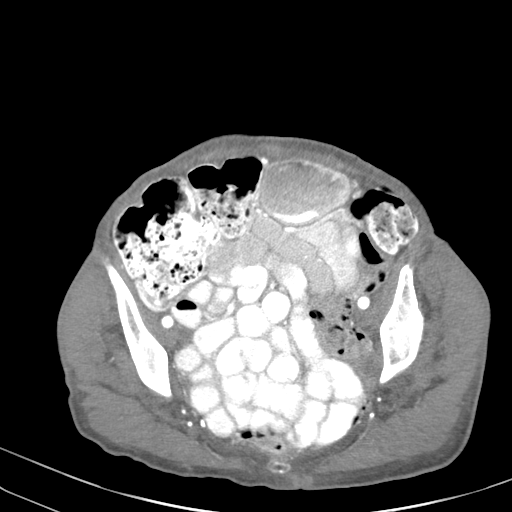
[im 37/67  soft-tissue]
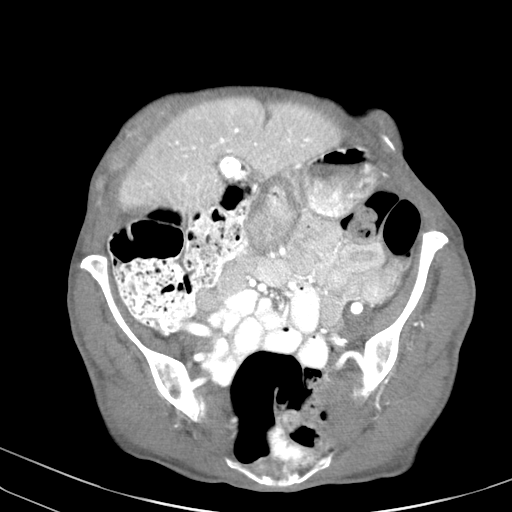
[im 37/67  lung]
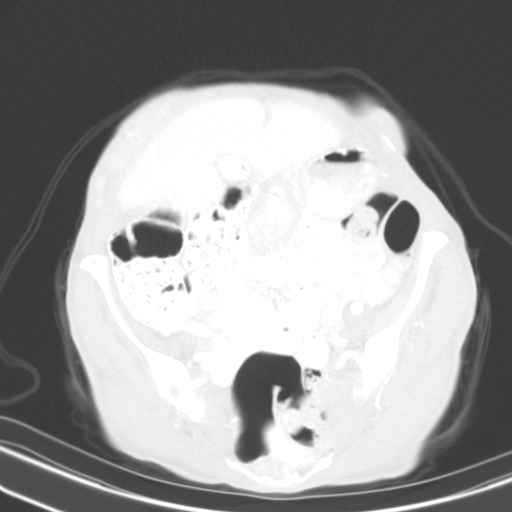
[im 45/67  soft-tissue]
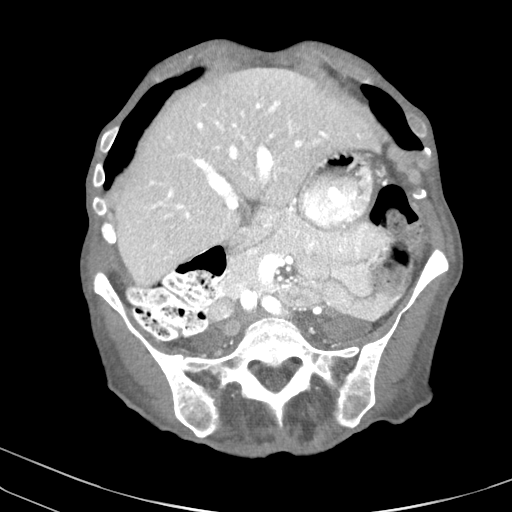
[im 45/67  lung]
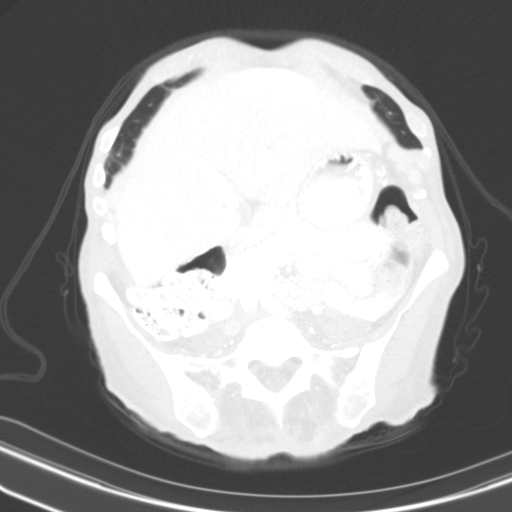
[im 52/67  soft-tissue]
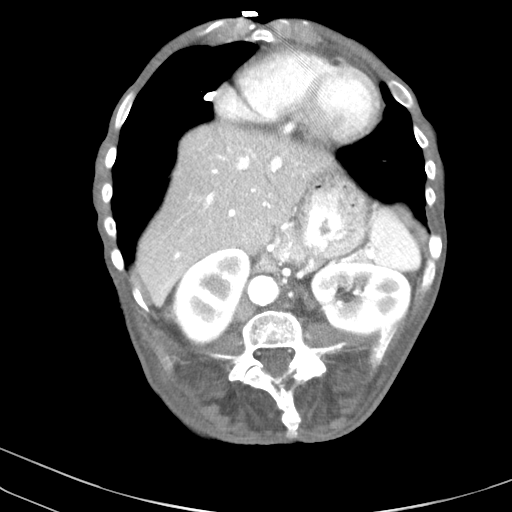
[im 52/67  lung]
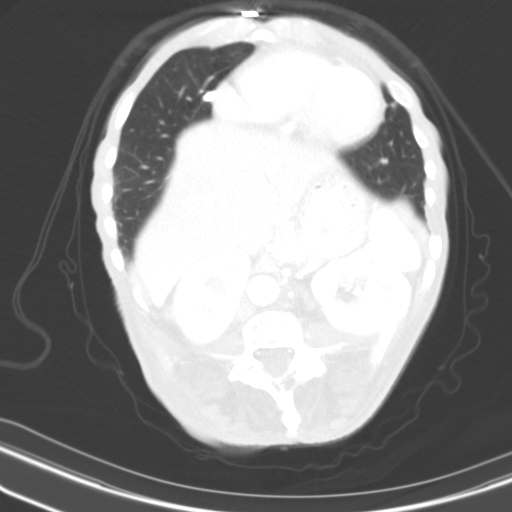
[im 59/67  soft-tissue]
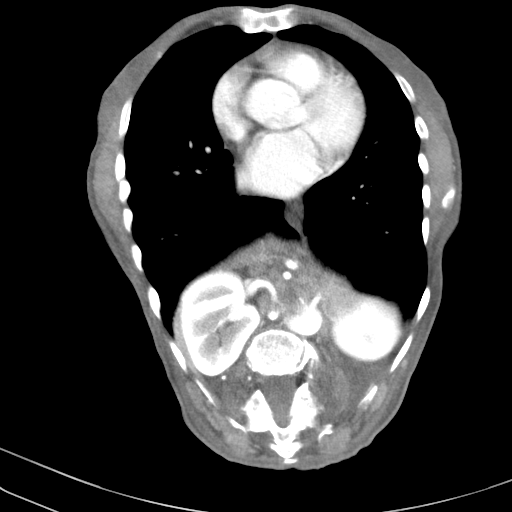
[im 59/67  lung]
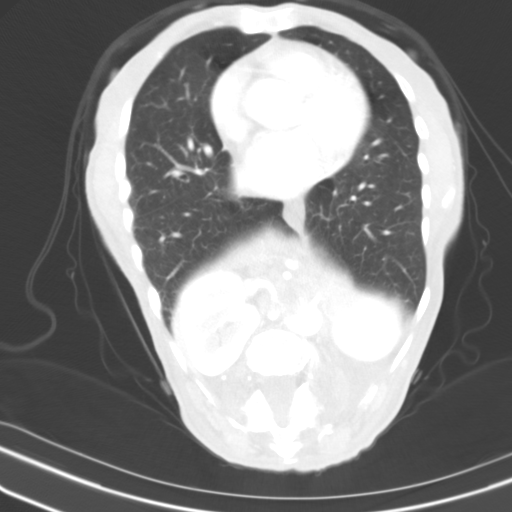

[Series 4: renal delay · axial · delayed · 0.61mm/px · z∈[-83,-13]mm · 3 of 30 slices shown (1 of 2)]
[im 8/30  soft-tissue]
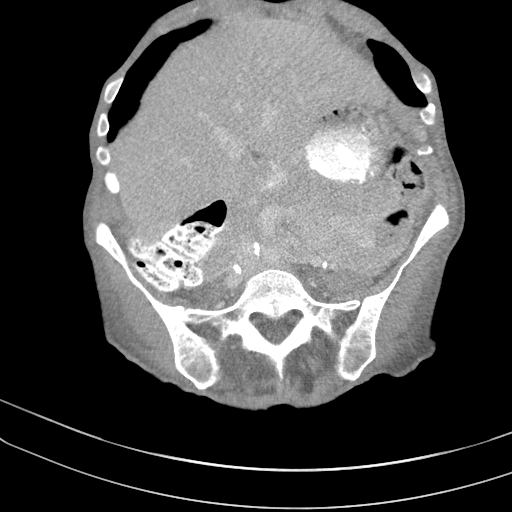
[im 15/30  soft-tissue]
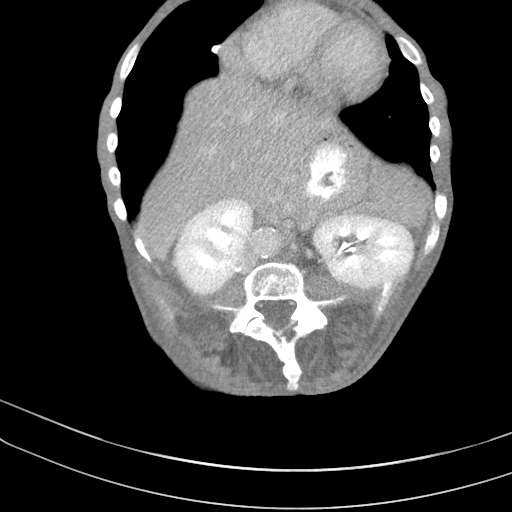
[im 22/30  soft-tissue]
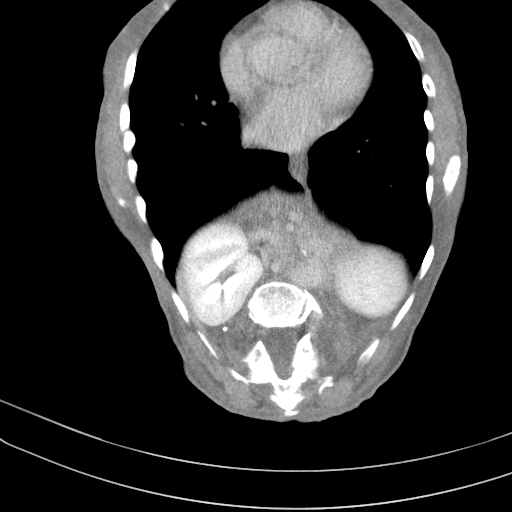

[Series 8: renal delay · axial · delayed · 0.62mm/px · z∈[-83,-48]mm · 2 of 30 slices shown (2 of 2)]
[im 8/30  soft-tissue]
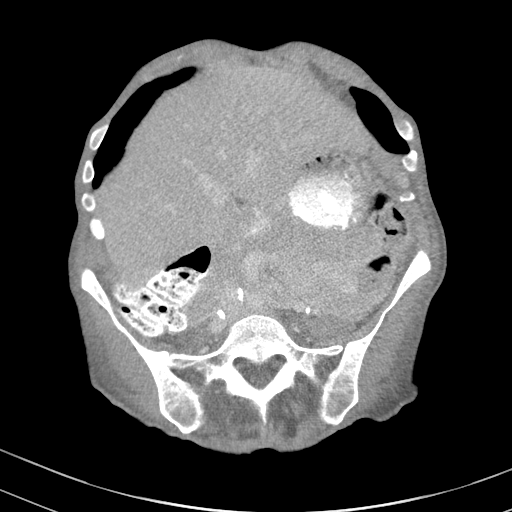
[im 15/30  soft-tissue]
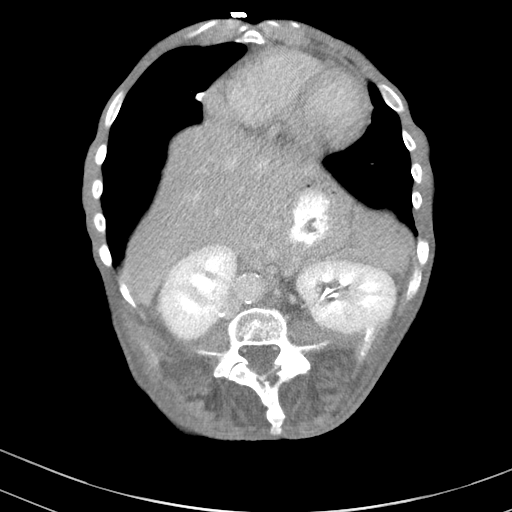

[13 of 32 positions shown; findings below may reference images not displayed]

FINDINGS: Lower chest: Unremarkable.

Hepatobiliary: No suspicious focal abnormality within the liver
parenchyma. Multiple calcified gallstones identified measuring in
the 10-15 mm size range. No intrahepatic or extrahepatic biliary
dilation.

Pancreas: No focal mass lesion. No dilatation of the main duct. No
intraparenchymal cyst. No peripancreatic edema.

Spleen: No splenomegaly. No focal mass lesion.

Adrenals/Urinary Tract: No adrenal nodule or mass. Kidneys
unremarkable. No evidence for hydroureter. The urinary bladder
appears normal for the degree of distention.

Stomach/Bowel: Stomach is moderately distended with food and
contrast. Duodenum is normally positioned as is the ligament of
Treitz. No small bowel wall thickening. No small bowel dilatation.
The terminal ileum is normal. The appendix is not well visualized,
but there is no edema or inflammation in the region of the cecum. No
gross colonic mass. No colonic wall thickening. Marked stool volume
throughout.

Vascular/Lymphatic: There is abdominal aortic atherosclerosis
without aneurysm. There is no gastrohepatic or hepatoduodenal
ligament lymphadenopathy. No retroperitoneal or mesenteric
lymphadenopathy. No pelvic sidewall lymphadenopathy.

Reproductive: The uterus is unremarkable.  There is no adnexal mass.

Other: No intraperitoneal free fluid.

Musculoskeletal: Marked kyphotic deformity noted at the region of
the thoracolumbar junction
IMPRESSION: 1. No acute findings in the abdomen or pelvis. Specifically, no
findings to suggest active colitis.
2. Marked stool volume. Imaging features compatible with clinical
constipation.
3. Cholelithiasis.
4. Aortic Atherosclerosis ([id number]).
